# Patient Record
Sex: Male | Born: 1980 | Race: Black or African American | Hispanic: No | Marital: Married | Smoking: Former smoker
Health system: Southern US, Community
[De-identification: ages and names within clinical notes are randomized; demographics above are authoritative.]

## PROBLEM LIST (undated history)

## (undated) DIAGNOSIS — J45909 Unspecified asthma, uncomplicated: Secondary | ICD-10-CM

## (undated) HISTORY — PX: WISDOM TOOTH EXTRACTION: SHX21

---

## 2003-05-08 ENCOUNTER — Emergency Department (HOSPITAL_COMMUNITY): Admission: EM | Admit: 2003-05-08 | Discharge: 2003-05-08 | Payer: Self-pay | Admitting: Emergency Medicine

## 2003-08-12 ENCOUNTER — Emergency Department (HOSPITAL_COMMUNITY): Admission: EM | Admit: 2003-08-12 | Discharge: 2003-08-12 | Payer: Self-pay | Admitting: Emergency Medicine

## 2010-04-06 ENCOUNTER — Emergency Department (HOSPITAL_COMMUNITY)
Admission: EM | Admit: 2010-04-06 | Discharge: 2010-04-06 | Payer: Self-pay | Source: Home / Self Care | Admitting: Family Medicine

## 2011-03-26 ENCOUNTER — Inpatient Hospital Stay (INDEPENDENT_AMBULATORY_CARE_PROVIDER_SITE_OTHER)
Admission: RE | Admit: 2011-03-26 | Discharge: 2011-03-26 | Disposition: A | Discharge status: Home / Self Care | Payer: 59 | Source: Ambulatory Visit | Attending: Emergency Medicine | Admitting: Emergency Medicine

## 2011-03-26 DIAGNOSIS — J309 Allergic rhinitis, unspecified: Secondary | ICD-10-CM

## 2011-04-04 ENCOUNTER — Ambulatory Visit
Admission: RE | Admit: 2011-04-04 | Discharge: 2011-04-04 | Disposition: A | Discharge status: Home / Self Care | Payer: 59 | Source: Ambulatory Visit | Attending: Family Medicine | Admitting: Family Medicine

## 2011-04-04 ENCOUNTER — Other Ambulatory Visit: Payer: Self-pay | Admitting: Family Medicine

## 2011-04-04 DIAGNOSIS — M25511 Pain in right shoulder: Secondary | ICD-10-CM

## 2013-07-16 ENCOUNTER — Other Ambulatory Visit (HOSPITAL_COMMUNITY)
Admission: RE | Admit: 2013-07-16 | Discharge: 2013-07-16 | Disposition: A | Discharge status: Home / Self Care | Payer: 59 | Source: Ambulatory Visit | Attending: Family Medicine | Admitting: Family Medicine

## 2013-07-16 DIAGNOSIS — Z113 Encounter for screening for infections with a predominantly sexual mode of transmission: Secondary | ICD-10-CM | POA: Insufficient documentation

## 2013-07-17 ENCOUNTER — Emergency Department (HOSPITAL_COMMUNITY)
Admission: EM | Admit: 2013-07-17 | Discharge: 2013-07-18 | Discharge status: Against Medical Advice | Payer: 59 | Attending: Emergency Medicine | Admitting: Emergency Medicine

## 2013-07-17 ENCOUNTER — Emergency Department (HOSPITAL_COMMUNITY)
Admission: EM | Admit: 2013-07-17 | Discharge: 2013-07-17 | Disposition: A | Discharge status: Home / Self Care | Payer: 59 | Source: Home / Self Care | Attending: Family Medicine | Admitting: Family Medicine

## 2013-07-17 ENCOUNTER — Emergency Department (INDEPENDENT_AMBULATORY_CARE_PROVIDER_SITE_OTHER): Payer: 59

## 2013-07-17 ENCOUNTER — Encounter (HOSPITAL_COMMUNITY): Payer: Self-pay | Admitting: Emergency Medicine

## 2013-07-17 DIAGNOSIS — K5900 Constipation, unspecified: Secondary | ICD-10-CM

## 2013-07-17 NOTE — ED Notes (Signed)
Patient states he is going on the 4th day of not having a bowel movement.  States was seen at Urgent Care today and given Miralax and Dulcolax; patient states he has been taking these without relief.

## 2013-07-17 NOTE — ED Provider Notes (Signed)
CSN: [id number]     Arrival date & time 07/17/13  1458 History   First MD Initiated Contact with Patient 07/17/13 1609     Chief Complaint  Patient presents with  . Constipation   (Consider location/radiation/quality/duration/timing/severity/associated sxs/prior Treatment) Patient is a 33 y.o. male presenting with constipation. The history is provided by the patient.  Constipation Severity:  Mild Time since last bowel movement:  5 days Timing:  Constant Chronicity:  New Context: not dehydration, not dietary changes and not medication   Stool description:  None produced Relieved by:  None tried Worsened by:  Nothing tried Ineffective treatments:  Laxatives and Miralax (only taken this am.) Associated symptoms: abdominal pain   Associated symptoms: no diarrhea, no fever, no nausea and no vomiting     History reviewed. No pertinent past medical history. History reviewed. No pertinent past surgical history. History reviewed. No pertinent family history. History  Substance Use Topics  . Smoking status: Never Smoker   . Smokeless tobacco: Not on file  . Alcohol Use: Yes    Review of Systems  Constitutional: Negative for fever.  Gastrointestinal: Positive for abdominal pain and constipation. Negative for nausea, vomiting, diarrhea, blood in stool, abdominal distention, anal bleeding and rectal pain.       No hemorrhoids.    Allergies  Review of patient's allergies indicates no known allergies.  Home Medications   Current Outpatient Rx  Name  Route  Sig  Dispense  Refill  . Bisacodyl (DULCOLAX PO)   Oral   Take by mouth.          BP 110/70  Pulse 79  Temp(Src) 98.5 F (36.9 C) (Oral)  Resp 22  SpO2 96% Physical Exam  Nursing note and vitals reviewed. Constitutional: He is oriented to person, place, and time. He appears well-developed and well-nourished.  Abdominal: Soft. Bowel sounds are normal. He exhibits no distension and no mass. There is no tenderness. There  is no rebound and no guarding.  Neurological: He is alert and oriented to person, place, and time.  Skin: Skin is warm and dry.    ED Course  Procedures (including critical care time) Labs Review Labs Reviewed - No data to display Imaging Review Dg Abd 1 View  07/17/2013   CLINICAL DATA:  No bowel movement in 4 days. Tightness in the mid to lower abdomen.  EXAM: ABDOMEN - 1 VIEW  COMPARISON:  None.  FINDINGS: Moderate increased stool is noted in the rectum and colon. There is no obstruction.  Normal soft tissues and bony structures.  IMPRESSION: No acute findings. Moderate increased stool in the colon and rectum.   Electronically Signed   By: Lajean Manes M.D.   On: 07/17/2013 16:53    EKG Interpretation    Date/Time:    Ventricular Rate:    PR Interval:    QRS Duration:   QT Interval:    QTC Calculation:   R Axis:     Text Interpretation:              MDM  X-rays reviewed and report per radiologist.    Billy Fischer, MD 07/17/13 [id number]

## 2013-07-17 NOTE — ED Notes (Signed)
Pt  Reports  He  Has  Been  Constipated  For  About  5  Days         Had  A  Hard  bm        On  Sunday          He reports  Some  Bloating  At this  Time            He           denys  Any  History     Of  Constipation          He  denys  Any  Vomiting  He  Ambulated  To  Room  With a  Steady  Fluid  Gait

## 2013-07-17 NOTE — ED Notes (Signed)
No answer in all waiting rooms

## 2013-07-17 NOTE — Discharge Instructions (Signed)
Take miralax daily, drink plenty of water, see your doctor if further problems.

## 2013-07-17 NOTE — ED Notes (Signed)
Pt  Advised  Per  Dr  Juventino Slovak  That  He  May  Get a  Bottle  Of  mag  Citrate   And  Drink  Tonight        Pt  Advised  To  Drink lots  Of  Fluids  Tonight

## 2013-07-18 NOTE — ED Notes (Signed)
Unable to locate pt in all waiting areas 

## 2013-07-18 NOTE — ED Provider Notes (Signed)
I did not personally evaluate this patient.  This patient left the emergency department after triage but before being seen by a physician or physician mid-level  Hoy Morn, MD 07/18/13 [phone]

## 2014-08-22 ENCOUNTER — Encounter (HOSPITAL_COMMUNITY): Payer: Self-pay

## 2014-08-22 ENCOUNTER — Emergency Department (INDEPENDENT_AMBULATORY_CARE_PROVIDER_SITE_OTHER)
Admission: EM | Admit: 2014-08-22 | Discharge: 2014-08-22 | Disposition: A | Discharge status: Home / Self Care | Payer: 59 | Source: Home / Self Care

## 2014-08-22 DIAGNOSIS — R21 Rash and other nonspecific skin eruption: Secondary | ICD-10-CM

## 2014-08-22 DIAGNOSIS — I10 Essential (primary) hypertension: Secondary | ICD-10-CM

## 2014-08-22 MED ORDER — CEPHALEXIN 500 MG PO CAPS: 500.0000 mg | ORAL_CAPSULE | Freq: Two times a day (BID) | ORAL | Status: DC

## 2014-08-22 MED ORDER — PERMETHRIN 5 % EX CREA: TOPICAL_CREAM | CUTANEOUS | Status: DC

## 2014-08-22 MED ORDER — PREDNISONE 50 MG PO TABS: ORAL_TABLET | ORAL | Status: DC

## 2014-08-22 NOTE — ED Notes (Signed)
C/o 2 week duration of rash on scalp, upper chest, upper back. Denies changes in diet, medications, detergent. No one else in home affected

## 2014-08-22 NOTE — Discharge Instructions (Signed)
The cause of your rash is not clear. This may be an allergic reaction, bacterial infection called folliculitis, or from scabies. Please start the antibiotic for the possible bacterial infection. Please use the permethrin cream as instructed. Please use the steroids for itching. Please remember to change your washcloths and razor very frequently. Please return if her symptoms do not improve. Please consider eating some yogurt daily or taking a probiotic while on antibiotics if you develop any diarrhea or upset stomach.

## 2014-08-22 NOTE — ED Provider Notes (Signed)
CSN: [id number]     Arrival date & time 08/22/14  6144 History   None    Chief Complaint  Patient presents with  . Rash   (Consider location/radiation/quality/duration/timing/severity/associated sxs/prior Treatment) HPI  Rash: started 2 wks ago. No change in soaps, lotions, or detergents. No change in diet. midly itchy. Benadryl w/o benefit. Rash is on head (shaved), and upper back and chest. Pt works out at gym and puts towel on head and shoulders every time. Lives w/ daughter and father and no one else w/ rash. Rash is not changing in nature or location. Not getting worse. Stated hotel bed 3 weeks ago. Uses a washcloth in the shower that he changes once every couple of days. Uses an antibacterial soap.  History reviewed. No pertinent past medical history. History reviewed. No pertinent past surgical history. Family History  Problem Relation Age of Onset  . Cancer Mother     stomach????   History  Substance Use Topics  . Smoking status: Never Smoker   . Smokeless tobacco: Not on file  . Alcohol Use: Yes    Review of Systems Per HPI with all other pertinent systems negative.   Allergies  Review of patient's allergies indicates no known allergies.  Home Medications   Prior to Admission medications   Medication Sig Start Date End Date Taking? Authorizing Provider  Bisacodyl (DULCOLAX PO) Take by mouth.    Historical Provider, MD  cephALEXin (KEFLEX) 500 MG capsule Take 1 capsule (500 mg total) by mouth 2 (two) times daily. 08/22/14   Waldemar Dickens, MD  permethrin (ELIMITE) 5 % cream Apply topically to entire body and leave on overnight. Wash off in the morning and reapply in 14 days 08/22/14   Waldemar Dickens, MD  predniSONE (DELTASONE) 50 MG tablet Take daily with breakfast 08/22/14   Waldemar Dickens, MD   BP 147/91 mmHg  Pulse 58  Temp(Src) 97.7 F (36.5 C) (Oral)  SpO2 98% Physical Exam  Constitutional: He is oriented to person, place, and time. He appears  well-developed and well-nourished.  HENT:  Head: Normocephalic and atraumatic.  Eyes: EOM are normal. Pupils are equal, round, and reactive to light.  Neck: Normal range of motion.  Cardiovascular: Normal rate.   No murmur heard. Pulmonary/Chest: Effort normal and breath sounds normal. No respiratory distress.  Abdominal: Soft. Bowel sounds are normal. He exhibits no distension.  Musculoskeletal: Normal range of motion. He exhibits no edema or tenderness.  Neurological: He is alert and oriented to person, place, and time.  Skin:  Small incised and raised lesions over the scalp. Lower neck upper shoulder and chest region with few red raised lesions surrounding hair follicles.  Psychiatric: He has a normal mood and affect. His behavior is normal.    ED Course  Procedures (including critical care time) Labs Review Labs Reviewed - No data to display  Imaging Review No results found.   MDM   1. Rash   2. Essential hypertension    Rash etiology is not clear. Favor thick folliculitis versus scabies versus allergic reaction. Patient to start Keflex 500 twice a day. If no improvement of also prescribed permethrin cream and instructed patient on proper use. Start 5 day of prednisone 50 for itching relief. Pamala Duffel, MD Family Medicine 08/22/2014, 10:17 AM      Waldemar Dickens, MD 08/22/14 1017

## 2014-12-13 IMAGING — CR DG ABDOMEN 1V
1 series · 1 of 1 positions shown · non-contrast
Comparison: None.

CLINICAL DATA: No bowel movement in 4 days. Tightness in the mid to
lower abdomen.

EXAM:
ABDOMEN - 1 VIEW

[view not recorded]
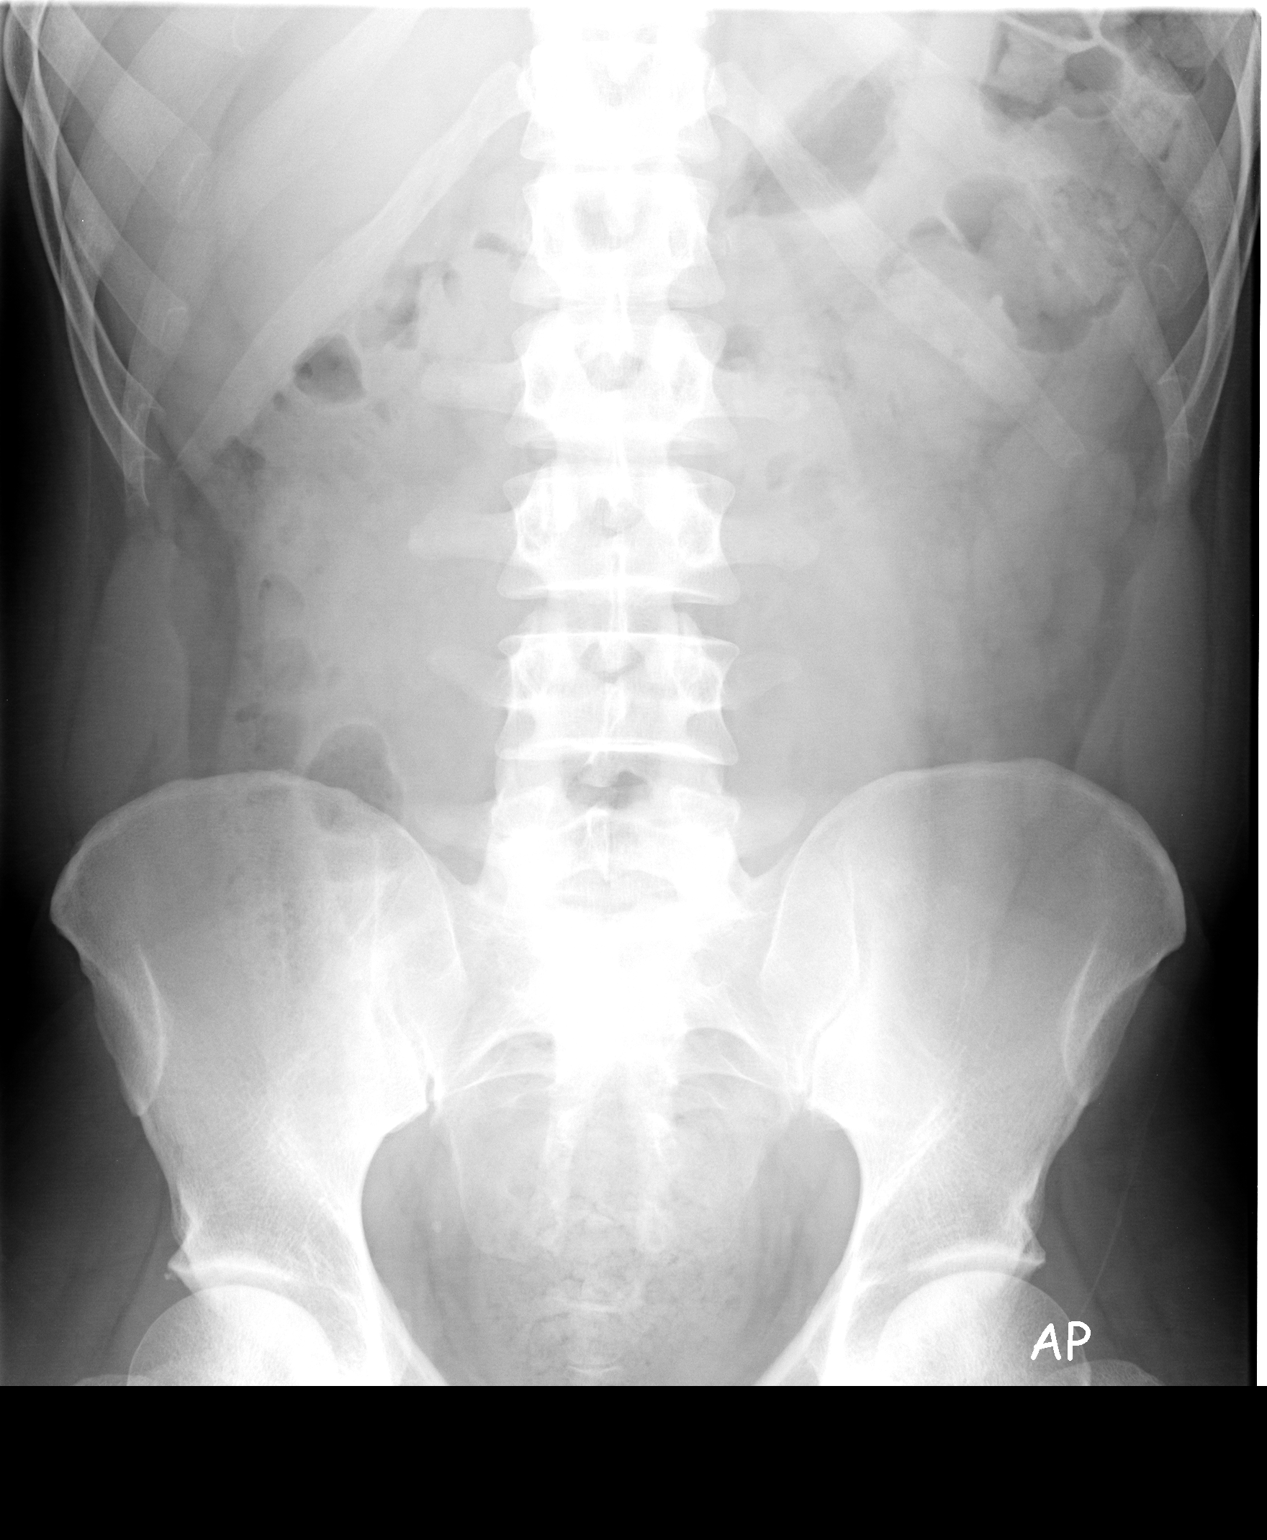

[1 of 1 positions shown; findings below may reference images not displayed]

FINDINGS: Moderate increased stool is noted in the rectum and colon. There is
no obstruction.

Normal soft tissues and bony structures.
IMPRESSION: No acute findings. Moderate increased stool in the colon and rectum.

## 2015-09-07 ENCOUNTER — Encounter: Payer: Self-pay | Admitting: Family Medicine

## 2015-09-07 ENCOUNTER — Ambulatory Visit (INDEPENDENT_AMBULATORY_CARE_PROVIDER_SITE_OTHER): Payer: Commercial Managed Care - HMO | Admitting: Family Medicine

## 2015-09-07 VITALS — Temp 99.2°F | Wt 247.0 lb

## 2015-09-07 DIAGNOSIS — K122 Cellulitis and abscess of mouth: Secondary | ICD-10-CM | POA: Diagnosis not present

## 2015-09-07 DIAGNOSIS — J020 Streptococcal pharyngitis: Secondary | ICD-10-CM | POA: Diagnosis not present

## 2015-09-07 DIAGNOSIS — J029 Acute pharyngitis, unspecified: Secondary | ICD-10-CM | POA: Diagnosis not present

## 2015-09-07 LAB — POCT RAPID STREP A (OFFICE): Rapid Strep A Screen: POSITIVE — AB

## 2015-09-07 MED ORDER — AMOXICILLIN-POT CLAVULANATE 875-125 MG PO TABS: 1.0000 | ORAL_TABLET | Freq: Two times a day (BID) | ORAL | Status: DC

## 2015-09-07 MED ORDER — CEFTRIAXONE SODIUM 1 G IJ SOLR
500.0000 mg | Freq: Once | INTRAMUSCULAR | Status: AC
  Administered 2015-09-07: 500 mg via INTRAMUSCULAR

## 2015-09-07 NOTE — Progress Notes (Signed)
   Subjective:    Patient ID: Jeffrey Cummings, male    DOB: [birth year], 35 y.o.   MRN: [id number]  Sore Throat  This is a new problem. The current episode started today. Associated symptoms comments: Low grade fever. Treatments tried: salt water, sol u medrol injection    Patient relates a swollen sore throat went to the nurse practitioner's were was diagnosed with strep placed on amoxicillin patient decided not taken. Patient decided to come in and be seen. He was seen by the nurse practitioner earlier this month for the flu and just recently stopped Tamiflu. Patient also had pinkeye a few weeks ago no other illness   Review of Systems Significant pharyngitis relates soreness with swallowing denies high fever chills sweats.    Objective:   Physical Exam  Uvula is red swollen. Tonsillar region swollen erythematous neck no masses lungs clear  Patient not toxic    Assessment & Plan:  Strep throat Uvulitis Antibiotics prescribed Rocephin shot given Should gradually get better over the next few days Work excuse for the next 3 days It progressively worse notify us or go to ER

## 2017-01-18 DIAGNOSIS — D179 Benign lipomatous neoplasm, unspecified: Secondary | ICD-10-CM | POA: Diagnosis not present

## 2017-01-18 DIAGNOSIS — K219 Gastro-esophageal reflux disease without esophagitis: Secondary | ICD-10-CM | POA: Diagnosis not present

## 2017-01-18 DIAGNOSIS — Z0000 Encounter for general adult medical examination without abnormal findings: Secondary | ICD-10-CM | POA: Diagnosis not present

## 2018-01-17 DIAGNOSIS — K219 Gastro-esophageal reflux disease without esophagitis: Secondary | ICD-10-CM | POA: Diagnosis not present

## 2018-01-17 DIAGNOSIS — Z0000 Encounter for general adult medical examination without abnormal findings: Secondary | ICD-10-CM | POA: Diagnosis not present

## 2018-01-17 DIAGNOSIS — D170 Benign lipomatous neoplasm of skin and subcutaneous tissue of head, face and neck: Secondary | ICD-10-CM | POA: Diagnosis not present

## 2018-03-20 DIAGNOSIS — Z23 Encounter for immunization: Secondary | ICD-10-CM | POA: Diagnosis not present

## 2018-05-08 DIAGNOSIS — Z5181 Encounter for therapeutic drug level monitoring: Secondary | ICD-10-CM | POA: Diagnosis not present

## 2018-05-30 ENCOUNTER — Ambulatory Visit: Payer: Self-pay | Admitting: Surgery

## 2018-05-30 DIAGNOSIS — D170 Benign lipomatous neoplasm of skin and subcutaneous tissue of head, face and neck: Secondary | ICD-10-CM | POA: Diagnosis not present

## 2018-05-30 NOTE — H&P (Signed)
History of Present Illness Jeffrey Cummings. Jeffrey Dowd MD; 05/30/2018 3:12 PM) The patient is a 37 year old male who presents with a complaint of Mass. PCP is Dr. Iona Beard Referred for enlarging mass on his posterior neck This is a healthy 37 year old male who presents with several years of a small palpable mass on the left posterior neck. This has become larger and is becoming uncomfortable. It has never become infected. There is no drainage from the site. He was examined by Dr. Berdine Addison who felt that this represented a lipoma. He is referred to [place] for surgical evaluation for excision.   Past Surgical History Andreas Blower, [place]; 05/30/2018 2:26 PM) Oral Surgery  Diagnostic Studies History Andreas Blower, [place]; 05/30/2018 2:26 PM) Colonoscopy never  Allergies Andreas Blower, [place]; 05/30/2018 2:29 PM) No Known Drug Allergies [05/30/2018]:  Medication History Andreas Blower, [place]; 05/30/2018 2:29 PM) No Current Medications Medications Reconciled  Social History Andreas Blower, CMA; 05/30/2018 2:26 PM) Alcohol use Moderate alcohol use. Caffeine use Tea. Illicit drug use Remotely quit drug use. Tobacco use Former smoker.  Family History Andreas Blower, [place]; 05/30/2018 2:26 PM) Colon Cancer Mother.  Other Problems Andreas Blower, CMA; 05/30/2018 2:26 PM) Asthma Gastroesophageal Reflux Disease     Review of Systems (Armen Ferguson CMA; 05/30/2018 2:26 PM) General Not Present- Appetite Loss, Chills, Fatigue, Fever, Night Sweats, Weight Gain and Weight Loss. Skin Not Present- Change in Wart/Mole, Dryness, Hives, Jaundice, New Lesions, Non-Healing Wounds, Rash and Ulcer. HEENT Present- Seasonal Allergies. Not Present- Earache, Hearing Loss, Hoarseness, Nose Bleed, Oral Ulcers, Ringing in the Ears, Sinus Pain, Sore Throat, Visual Disturbances, Wears glasses/contact lenses and Yellow Eyes. Respiratory Not Present- Bloody sputum, Chronic Cough, Difficulty Breathing, Snoring and  Wheezing. Breast Not Present- Breast Mass, Breast Pain, Nipple Discharge and Skin Changes. Cardiovascular Not Present- Chest Pain, Difficulty Breathing Lying Down, Leg Cramps, Palpitations, Rapid Heart Rate, Shortness of Breath and Swelling of Extremities. Gastrointestinal Not Present- Abdominal Pain, Bloating, Bloody Stool, Change in Bowel Habits, Chronic diarrhea, Constipation, Difficulty Swallowing, Excessive gas, Gets full quickly at meals, Hemorrhoids, Indigestion, Nausea, Rectal Pain and Vomiting. Male Genitourinary Not Present- Blood in Urine, Change in Urinary Stream, Frequency, Impotence, Nocturia, Painful Urination, Urgency and Urine Leakage. Musculoskeletal Not Present- Back Pain, Joint Pain, Joint Stiffness, Muscle Pain, Muscle Weakness and Swelling of Extremities. Neurological Not Present- Decreased Memory, Fainting, Headaches, Numbness, Seizures, Tingling, Tremor, Trouble walking and Weakness. Psychiatric Not Present- Anxiety, Bipolar, Change in Sleep Pattern, Depression, Fearful and Frequent crying. Endocrine Not Present- Cold Intolerance, Excessive Hunger, Hair Changes, Heat Intolerance, Hot flashes and New Diabetes. Hematology Not Present- Blood Thinners, Easy Bruising, Excessive bleeding, Gland problems, HIV and Persistent Infections.  Vitals (Armen Ferguson CMA; 05/30/2018 2:28 PM) 05/30/2018 2:27 PM Weight: 247.25 lb Height: 72in Body Surface Area: 2.33 m Body Mass Index: 33.53 kg/m  Temp.: 97.48F  Pulse: 87 (Regular)  P.OX: 94% (Room air) BP: 154/90 (Sitting, Left Arm, Standard)      Physical Exam Rodman Key K. Josephus Harriger MD; 05/30/2018 3:13 PM)  The physical exam findings are as follows: Note:WDWN in NAD Eyes: Pupils equal, round; sclera anicteric HENT: Oral mucosa moist; good dentition Neck: 4 x 3 cm subcutaneous mass, left posterior neck; no drainage; no sign of infection; no thyromegaly Lungs: CTA bilaterally; normal respiratory effort CV: Regular rate and  rhythm; no murmurs; extremities well-perfused with no edema Abd: +bowel sounds, soft, non-tender, no palpable organomegaly; no palpable hernias Skin: Warm, dry; no sign of jaundice Psychiatric - alert and oriented x 4;  calm mood and affect    Assessment & Plan Jeffrey Cummings. 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 The surgical procedure has been discussed with the patient. Potential risks, benefits, alternative treatments, and expected outcomes have been explained. All of the patient's questions at this time have been answered. The likelihood of reaching the patient's treatment goal is good. The patient understand the proposed surgical procedure and wishes to proceed.  Jeffrey Cummings. Georgette Dover, MD, [facility] Surgery  General/ Trauma Surgery Beeper [phone]  05/30/2018 3:13 PM

## 2018-06-04 ENCOUNTER — Ambulatory Visit: Payer: Commercial Managed Care - HMO | Admitting: Family Medicine

## 2018-06-04 ENCOUNTER — Encounter: Payer: Self-pay | Admitting: Family Medicine

## 2018-06-04 ENCOUNTER — Ambulatory Visit: Payer: 59 | Admitting: Family Medicine

## 2018-06-04 VITALS — BP 152/90 | Temp 99.0°F | Wt 243.0 lb

## 2018-06-04 DIAGNOSIS — J029 Acute pharyngitis, unspecified: Secondary | ICD-10-CM | POA: Diagnosis not present

## 2018-06-04 DIAGNOSIS — J020 Streptococcal pharyngitis: Secondary | ICD-10-CM | POA: Diagnosis not present

## 2018-06-04 LAB — POCT RAPID STREP A (OFFICE): Rapid Strep A Screen: POSITIVE — AB

## 2018-06-04 MED ORDER — AMOXICILLIN-POT CLAVULANATE 875-125 MG PO TABS: 1.0000 | ORAL_TABLET | Freq: Two times a day (BID) | ORAL | 0 refills | Status: DC

## 2018-06-04 NOTE — Progress Notes (Signed)
   Subjective:    Patient ID: Jeffrey Cummings, male    DOB: 02/26/1981, 37 y.o.   MRN: [id number]  HPI  Patient is here today with complaints of a sore throat on right side, right ear pain, hurts to swallow, mild headache.Mucux clearing in throat with thick green mucus. He states this started on Thursday.Was checked for strep at work and it was negative. He has been taking Thera flu since Thursday this is his third box he is on, used cloroceptic.  Significant sore throat on the right side but started off with sore throat on both sides but over the weekend is primarily more his right side no difficulty with drooling but he does state it hurts to swallow denies high fever chills Review of Systems  Constitutional: Negative for activity change, chills and fever.  HENT: Positive for congestion and rhinorrhea. Negative for ear pain.   Eyes: Negative for discharge.  Respiratory: Positive for cough. Negative for wheezing.   Cardiovascular: Negative for chest pain.  Gastrointestinal: Negative for nausea and vomiting.  Musculoskeletal: Negative for arthralgias.       Objective:   Physical Exam  Constitutional: He appears well-developed.  HENT:  Head: Normocephalic.  Mouth/Throat: Oropharyngeal exudate present. No tonsillar abscesses.  Neck: Normal range of motion.  Cardiovascular: Normal rate, regular rhythm and normal heart sounds.  No murmur heard. Pulmonary/Chest: Effort normal and breath sounds normal. He has no wheezes.  Lymphadenopathy:    He has no cervical adenopathy.  Neurological: He exhibits normal muscle tone.  Skin: Skin is warm and dry.  Nursing note and vitals reviewed.  I do not see tonsillar abscess with this but I do see bilateral throat erythema with enlargement and redness and some exudate  Results for orders placed or performed in visit on 06/04/18  POCT rapid strep A  Result Value Ref Range   Rapid Strep A Screen Positive (A) Negative        Assessment &  Plan:  Strep throat Patient was warned that if this becomes worse he may need to be rechecked Start antibiotics he should see an improvement over the course the next 2 to 3 days Follow-up if progressive troubles or if worse

## 2018-06-04 NOTE — Patient Instructions (Signed)

## 2019-01-09 ENCOUNTER — Other Ambulatory Visit: Payer: Self-pay

## 2019-01-09 ENCOUNTER — Emergency Department (HOSPITAL_COMMUNITY)
Admission: EM | Admit: 2019-01-09 | Discharge: 2019-01-09 | Disposition: A | Discharge status: Home / Self Care | Payer: 59 | Attending: Emergency Medicine | Admitting: Emergency Medicine

## 2019-01-09 ENCOUNTER — Encounter (HOSPITAL_COMMUNITY): Payer: Self-pay

## 2019-01-09 DIAGNOSIS — Z20828 Contact with and (suspected) exposure to other viral communicable diseases: Secondary | ICD-10-CM | POA: Diagnosis not present

## 2019-01-09 DIAGNOSIS — R0602 Shortness of breath: Secondary | ICD-10-CM | POA: Insufficient documentation

## 2019-01-09 DIAGNOSIS — J45909 Unspecified asthma, uncomplicated: Secondary | ICD-10-CM | POA: Diagnosis not present

## 2019-01-09 HISTORY — DX: Unspecified asthma, uncomplicated: J45.909

## 2019-01-09 NOTE — Discharge Instructions (Signed)
You likely have a viral illness.  This should be treated symptomatically. You are being tested for coronavirus.  If the results are positive, you will receive a phone call.  If negative, you will not.  Either way, you may check online on MyChart. It is important that you quarantine at home until we have the results back.  If negative, you may go back to work and continue to resume your normal activities.  If positive, you will need to stay at home and continue to quarantine for a total of a minimum of 7 days from when symptoms began, as long as you are also fever free for 3 days and symptoms are improving. Return to the emergency room if you develop severe worsening shortness of breath. Continue use your inhaler as needed for shortness of breath or wheezing. Use cough syrups as needed for cough. Return to the ER with any new, worsening, or concerning symptoms.

## 2019-01-09 NOTE — ED Triage Notes (Addendum)
Per pt: For the last few days "my breathing hasn't been right. I feel like I can't get my breath in. I feel like I'm breathing hard". Pt endorsers dyspnea with exertion and at rest. Pts lungs CTA. Pt states that his friend and his whole family tested positive for COVID 19. Pt 100% on room air and does not appear to be in any acute distress in triage. Pt states that he has a hx of asthma, not on daily medication for this and has a rescue albuterol inhaler that he has been using with some relief. Pt states that he has still been exercising and running.

## 2019-01-09 NOTE — ED Provider Notes (Signed)
[facility] EMERGENCY DEPARTMENT Provider Note   CSN: [id number] Arrival date & time: 01/09/19  0719    History   Chief Complaint Chief Complaint  Patient presents with  . Shortness of Breath    HPI Jeffrey Cummings is a 38 y.o. male presenting for evaluation of shortness of breath.  Patient states that the past 2 to 3 days, he has had increased shortness of breath.  He states he is still able to do his normal activities and still able to exercise, but he feels like he is working slightly harder to breathe.  He also reports a dry cough.  He has been using his inhaler daily, which is improved his symptoms.  Normally he does not need his inhaler daily.  He states he has a friend who tested positive for coronavirus recently.  He denies known sick contacts at work.  He has associated nasal congestion.  He denies fevers, chills, sore throat, chest pain, nausea, vomiting, abdominal pain, diarrhea.  He has no medical problems other than asthma.  He takes no medications daily.  He denies tobacco use.      HPI  Past Medical History:  Diagnosis Date  . Asthma     There are no active problems to display for this patient.   History reviewed. No pertinent surgical history.      Home Medications    Prior to Admission medications   Medication Sig Start Date End Date Taking? Authorizing Provider  amoxicillin-clavulanate (AUGMENTIN) 875-125 MG tablet Take 1 tablet by mouth 2 (two) times daily. 06/04/18   Kathyrn Drown, MD    Family History Family History  Problem Relation Age of Onset  . Cancer Mother        stomach????    Social History Social History   Tobacco Use  . Smoking status: Never Smoker  . Smokeless tobacco: Current User  Substance Use Topics  . Alcohol use: Yes  . Drug use: No     Allergies   Patient has no known allergies.   Review of Systems Review of Systems  All other systems reviewed and are negative.    Physical Exam  Updated Vital Signs BP (!) 146/103 (BP Location: Right Arm)   Pulse (!) 57   Temp 98.2 F (36.8 C) (Oral)   Resp 18   SpO2 100%   Physical Exam Vitals signs and nursing note reviewed.  Constitutional:      General: He is not in acute distress.    Appearance: He is well-developed.     Comments: Appears nontoxic  HENT:     Head: Normocephalic and atraumatic.  Eyes:     Extraocular Movements: Extraocular movements intact.     Conjunctiva/sclera: Conjunctivae normal.     Pupils: Pupils are equal, round, and reactive to light.  Neck:     Musculoskeletal: Normal range of motion and neck supple.  Cardiovascular:     Rate and Rhythm: Normal rate and regular rhythm.     Pulses: Normal pulses.  Pulmonary:     Effort: Pulmonary effort is normal. No respiratory distress.     Breath sounds: Normal breath sounds. No wheezing.     Comments: Speaking in full sentences. Clear lung sounds in all fields. No respiratory distress.  Abdominal:     General: There is no distension.     Palpations: Abdomen is soft. There is no mass.     Tenderness: There is no abdominal tenderness. There is no guarding or rebound.  Musculoskeletal: Normal range of motion.     Right lower leg: No edema.     Left lower leg: No edema.     Comments: No leg pain or swelling  Skin:    General: Skin is warm and dry.     Capillary Refill: Capillary refill takes less than 2 seconds.  Neurological:     Mental Status: He is alert and oriented to person, place, and time.      ED Treatments / Results  Labs (all labs ordered are listed, but only abnormal results are displayed) Labs Reviewed  NOVEL CORONAVIRUS, NAA (HOSPITAL ORDER, SEND-OUT TO REF LAB)    EKG None  Radiology No results found.  Procedures Procedures (including critical care time)  Medications Ordered in ED Medications - No data to display   Initial Impression / Assessment and Plan / ED Course  I have reviewed the triage vital signs and the  nursing notes.  Pertinent labs & imaging results that were available during my care of the patient were reviewed by me and considered in my medical decision making (see chart for details).        Pt presenting for evaluation sob.  Physical examination, he appears nontoxic.  Pulmonary exam reassuring, clear lung sounds.  Sats at 100% on room air.  No signs of respiratory distress or shortness of breath at this time.  Consider likely viral illness, possibly coronavirus.  Will perform send out testing.  Ideally x-ray be beneficial today, low suspicion for pneumonia.  Discussed findings and plan with patient.  Discussed importance of quarantine until results have returned.  Discussed strict monitoring for worsening of symptoms, return if he develops worsening shortness of breath.  At this time, patient appears safe for discharge.  Return precautions given.  Patient states he understands and agrees to plan.  Jeffrey Cummings was evaluated in Emergency Department on 01/09/2019 for the symptoms described in the history of present illness. He was evaluated in the context of the global COVID-19 pandemic, which necessitated consideration that the patient might be at risk for infection with the SARS-CoV-2 virus that causes COVID-19. Institutional protocols and algorithms that pertain to the evaluation of patients at risk for COVID-19 are in a state of rapid change based on information released by regulatory bodies including the CDC and federal and state organizations. These policies and algorithms were followed during the patient's care in the ED.   Final Clinical Impressions(s) / ED Diagnoses   Final diagnoses:  Shortness of breath    ED Discharge Orders    None       Jeffrey Heidelberg, PA-C 01/09/19 0809    Long, Jeffrey Olds, MD 01/09/19 0900

## 2019-01-10 LAB — NOVEL CORONAVIRUS, NAA (HOSP ORDER, SEND-OUT TO REF LAB; TAT 18-24 HRS): SARS-CoV-2, NAA: NOT DETECTED

## 2020-12-07 ENCOUNTER — Other Ambulatory Visit: Payer: Self-pay | Admitting: Urology

## 2020-12-08 LAB — PSA: Prostate Specific Ag, Serum: 0.4 ng/mL (ref 0.0–4.0)

## 2020-12-09 ENCOUNTER — Telehealth: Payer: Self-pay

## 2020-12-09 NOTE — Telephone Encounter (Signed)
Patient informed normal PSA results(0.4), recheck in 1 year. Patient verbalized understanding.

## 2021-01-05 ENCOUNTER — Other Ambulatory Visit: Payer: Self-pay

## 2021-01-05 ENCOUNTER — Ambulatory Visit (INDEPENDENT_AMBULATORY_CARE_PROVIDER_SITE_OTHER): Payer: 59

## 2021-01-05 ENCOUNTER — Ambulatory Visit: Payer: 59 | Admitting: Podiatry

## 2021-01-05 DIAGNOSIS — M216X9 Other acquired deformities of unspecified foot: Secondary | ICD-10-CM

## 2021-01-05 DIAGNOSIS — M722 Plantar fascial fibromatosis: Secondary | ICD-10-CM

## 2021-01-05 DIAGNOSIS — M7732 Calcaneal spur, left foot: Secondary | ICD-10-CM

## 2021-01-05 MED ORDER — BETAMETHASONE SOD PHOS & ACET 6 (3-3) MG/ML IJ SUSP
6.0000 mg | Freq: Once | INTRAMUSCULAR | Status: AC
  Administered 2021-01-05: 6 mg

## 2021-01-05 NOTE — Patient Instructions (Signed)

## 2021-01-05 NOTE — Progress Notes (Signed)
  Subjective:  Patient ID: Jeffrey Cummings, male    DOB: 01/08/1981,  MRN: [id number]  Chief Complaint  Patient presents with   Heel Spurs    Left foot heel pain x 3 weeks. Pt states pain came once he started using the incline treadmill.     40 y.o. male presents with the above complaint. History confirmed with patient.   Objective:  Physical Exam: warm, good capillary refill, no trophic changes or ulcerative lesions, normal DP and PT pulses, and normal sensory exam. Left Foot: tenderness to palpation medial calcaneal tuber, no pain with calcaneal squeeze, decreased ankle joint ROM, and +Silverskiold test  Radiographs: X-ray of the left foot: no evidence of calcaneal stress fracture, plantar calcaneal spur, posterior calcaneal spur, and Haglund deformity noted  Assessment:   1. Plantar fasciitis of left foot   2. Equinus deformity of foot   3. Calcaneal spur of left foot      Plan:  Patient was evaluated and treated and all questions answered.  Plantar Fasciitis -XR reviewed with patient -Educated patient on stretching and icing of the affected limb -Plantar fascial brace dispensed -Injection delivered to the plantar fascia of the left foot.  Procedure: Injection Tendon/Ligament Consent: Verbal consent obtained. Location: Left plantar fascia at the glabrous junction; medial approach. Skin Prep: Alcohol. Injectate: 1 cc 0.5% marcaine plain, 1 cc betamethasone acetate-betamethasone sodium phosphate Disposition: Patient tolerated procedure well. Injection site dressed with a band-aid.  No follow-ups on file.

## 2021-02-05 ENCOUNTER — Ambulatory Visit: Payer: 59 | Admitting: Podiatry

## 2021-02-05 ENCOUNTER — Other Ambulatory Visit: Payer: Self-pay

## 2021-02-05 DIAGNOSIS — M722 Plantar fascial fibromatosis: Secondary | ICD-10-CM

## 2021-02-05 MED ORDER — TRIAMCINOLONE ACETONIDE 40 MG/ML IJ SUSP
20.0000 mg | Freq: Once | INTRAMUSCULAR | Status: AC
  Administered 2021-02-05: 20 mg

## 2021-02-05 NOTE — Progress Notes (Signed)
  Subjective:  Patient ID: Jeffrey Cummings, male    DOB: 10/01/1980,  MRN: [id number]  Chief Complaint  Patient presents with   Plantar Fasciitis    Pt states injection effective and wears his pf brace. Pt still in pain on lateral aspect but has had some improvement.   40 y.o. male presents with the above complaint. History confirmed with patient.   Objective:  Physical Exam: warm, good capillary refill, no trophic changes or ulcerative lesions, normal DP and PT pulses, and normal sensory exam. Left Foot: tenderness to palpation lateral calcaneal tuber, no pain with calcaneal squeeze, decreased ankle joint ROM, and +Silverskiold test Assessment:   1. Plantar fasciitis of left foot    Plan:  Patient was evaluated and treated and all questions answered.  Plantar Fasciitis -Much improved but with continued pain. -Repeat injection  -Dispense NS -Continue stretching. F/u should issues persist.  Procedure: Injection Tendon/Ligament Consent: Verbal consent obtained. Location: Left plantar fascia at the glabrous junction; lateral approach. Skin Prep: Alcohol. Injectate: 1 cc 0.5% marcaine plain, 0.5cc kenalog 40 Disposition: Patient tolerated procedure well. Injection site dressed with a band-aid.  Return if symptoms worsen or fail to improve.

## 2021-06-15 ENCOUNTER — Ambulatory Visit: Payer: 59 | Admitting: Podiatry

## 2021-06-15 ENCOUNTER — Other Ambulatory Visit: Payer: Self-pay

## 2021-06-15 ENCOUNTER — Encounter: Payer: Self-pay | Admitting: Podiatry

## 2021-06-15 DIAGNOSIS — M722 Plantar fascial fibromatosis: Secondary | ICD-10-CM | POA: Diagnosis not present

## 2021-06-15 MED ORDER — TRIAMCINOLONE ACETONIDE 40 MG/ML IJ SUSP
20.0000 mg | Freq: Once | INTRAMUSCULAR | Status: AC
  Administered 2021-06-15: 15:00:00 20 mg

## 2021-06-15 NOTE — Progress Notes (Signed)
°  Subjective:  Patient ID: Jeffrey Cummings, male    DOB: 02-13-1981,  MRN: [id number]  Chief Complaint  Patient presents with   Plantar Fasciitis    Follow up left heel   "It was the whole heel that hurt, now its the side of the foot and the side of the heel (laterally)"   40 y.o. male presents with the above complaint. History confirmed with patient.   Objective:  Physical Exam: warm, good capillary refill, no trophic changes or ulcerative lesions, normal DP and PT pulses, and normal sensory exam. Left Foot: tenderness to palpation lateral calcaneal tuber, no pain with calcaneal squeeze, decreased ankle joint ROM, and +Silverskiold test Assessment:   1. Plantar fasciitis of left foot    Plan:  Patient was evaluated and treated and all questions answered.  Plantar Fasciitis -Repeat injection today with lateral approach - this should resolve residual inflammation. F/u should it persist.  Procedure: Injection Tendon/Ligament Consent: Verbal consent obtained. Location: Left plantar fascia at the glabrous junction; lateral approach. Skin Prep: Alcohol. Injectate: 1 cc 0.5% marcaine plain, 0.5 cc kenalog 40 Disposition: Patient tolerated procedure well. Injection site dressed with a band-aid.  Return if symptoms worsen or fail to improve, for Plantar fasciitis, left foot.

## 2021-10-28 ENCOUNTER — Other Ambulatory Visit: Payer: Self-pay | Admitting: Family Medicine

## 2021-10-28 ENCOUNTER — Ambulatory Visit (INDEPENDENT_AMBULATORY_CARE_PROVIDER_SITE_OTHER): Payer: Self-pay

## 2021-10-28 DIAGNOSIS — R002 Palpitations: Secondary | ICD-10-CM

## 2022-09-13 ENCOUNTER — Ambulatory Visit
Admission: RE | Admit: 2022-09-13 | Discharge: 2022-09-13 | Disposition: A | Discharge status: Home / Self Care | Payer: 59 | Source: Ambulatory Visit | Attending: Family Medicine | Admitting: Family Medicine

## 2022-09-13 VITALS — BP 138/90 | HR 75 | Temp 98.5°F | Resp 20

## 2022-09-13 DIAGNOSIS — J029 Acute pharyngitis, unspecified: Secondary | ICD-10-CM | POA: Insufficient documentation

## 2022-09-13 DIAGNOSIS — J069 Acute upper respiratory infection, unspecified: Secondary | ICD-10-CM | POA: Diagnosis not present

## 2022-09-13 LAB — POCT RAPID STREP A (OFFICE): Rapid Strep A Screen: NEGATIVE

## 2022-09-13 MED ORDER — FLUTICASONE PROPIONATE 50 MCG/ACT NA SUSP: 1.0000 | Freq: Two times a day (BID) | NASAL | 2 refills | Status: AC

## 2022-09-13 MED ORDER — PROMETHAZINE-DM 6.25-15 MG/5ML PO SYRP: 5.0000 mL | ORAL_SOLUTION | Freq: Four times a day (QID) | ORAL | 0 refills | Status: DC | PRN

## 2022-09-13 MED ORDER — PREDNISONE 20 MG PO TABS: 40.0000 mg | ORAL_TABLET | Freq: Every day | ORAL | 0 refills | Status: DC

## 2022-09-13 NOTE — ED Provider Notes (Signed)
[facility]    CSN: [id number] Arrival date & time: 09/13/22  1121      History   Chief Complaint Chief Complaint  Patient presents with   Sore Throat    HPI Jeffrey Cummings is a 42 y.o. male.   Patient presenting today with 3-day history of nasal congestion, sore swollen feeling throat, cough.  Denies fever, chills, body aches, chest pain, shortness of breath, abdominal pain, nausea vomiting or diarrhea.  So far trying DayQuil, NyQuil, Claritin with minimal relief.  Does have a history of asthma and allergies on Claritin and albuterol as needed.  No known sick contacts recently.    Past Medical History:  Diagnosis Date   Asthma     There are no problems to display for this patient.   History reviewed. No pertinent surgical history.     Home Medications    Prior to Admission medications   Medication Sig Start Date End Date Taking? Authorizing Provider  fluticasone (FLONASE) 50 MCG/ACT nasal spray Place 1 spray into both nostrils 2 (two) times daily. 09/13/22  Yes Volney American, PA-C  predniSONE (DELTASONE) 20 MG tablet Take 2 tablets (40 mg total) by mouth daily with breakfast. 09/13/22  Yes Volney American, PA-C  promethazine-dextromethorphan (PROMETHAZINE-DM) 6.25-15 MG/5ML syrup Take 5 mLs by mouth 4 (four) times daily as needed. 09/13/22  Yes Volney American, PA-C  acyclovir (ZOVIRAX) 400 MG tablet Take 400 mg by mouth daily. 02/04/21   [provider]  albuterol (VENTOLIN HFA) 108 (90 Base) MCG/ACT inhaler Inhale into the lungs. 02/02/21   [provider]    Family History Family History  Problem Relation Age of Onset   Cancer Mother        stomach????    Social History Social History   Tobacco Use   Smoking status: Never   Smokeless tobacco: Current  Substance Use Topics   Alcohol use: Yes   Drug use: No     Allergies   Patient has no known allergies.   Review of Systems Review of  Systems Per HPI  Physical Exam Triage Vital Signs ED Triage Vitals  Enc Vitals Group     BP 09/13/22 1205 (!) 138/90     Pulse Rate 09/13/22 1205 75     Resp 09/13/22 1205 20     Temp 09/13/22 1205 98.5 F (36.9 C)     Temp Source 09/13/22 1205 Oral     SpO2 09/13/22 1205 98 %     Weight --      Height --      Head Circumference --      Peak Flow --      Pain Score 09/13/22 1206 7     Pain Loc --      Pain Edu? --      Excl. in [place]? --    No data found.  Updated Vital Signs BP (!) 138/90 (BP Location: Right Arm)   Pulse 75   Temp 98.5 F (36.9 C) (Oral)   Resp 20   SpO2 98%   Visual Acuity Right Eye Distance:   Left Eye Distance:   Bilateral Distance:    Right Eye Near:   Left Eye Near:    Bilateral Near:     Physical Exam Vitals and nursing note reviewed.  Constitutional:      Appearance: He is well-developed.  HENT:     Head: Atraumatic.     Right Ear: External ear normal.  Left Ear: External ear normal.     Nose: Rhinorrhea present.     Mouth/Throat:     Pharynx: Posterior oropharyngeal erythema present. No oropharyngeal exudate.  Eyes:     Conjunctiva/sclera: Conjunctivae normal.     Pupils: Pupils are equal, round, and reactive to light.  Cardiovascular:     Rate and Rhythm: Normal rate and regular rhythm.  Pulmonary:     Effort: Pulmonary effort is normal. No respiratory distress.     Breath sounds: No wheezing or rales.  Musculoskeletal:        General: Normal range of motion.     Cervical back: Normal range of motion and neck supple.  Lymphadenopathy:     Cervical: No cervical adenopathy.  Skin:    General: Skin is warm and dry.  Neurological:     Mental Status: He is alert and oriented to person, place, and time.  Psychiatric:        Behavior: Behavior normal.      UC Treatments / Results  Labs (all labs ordered are listed, but only abnormal results are displayed) Labs Reviewed  CULTURE, GROUP A STREP [facility])  POCT RAPID STREP  A (OFFICE)    EKG   Radiology No results found.  Procedures Procedures (including critical care time)  Medications Ordered in UC Medications - No data to display  Initial Impression / Assessment and Plan / UC Course  I have reviewed the triage vital signs and the nursing notes.  Pertinent labs & imaging results that were available during my care of the patient were reviewed by me and considered in my medical decision making (see chart for details).     Vital signs and exam very reassuring today, suggestive of a viral upper respiratory infection.  Rapid strep negative, throat culture pending for further rule out.  Declines viral testing today.  Treat symptomatically with prednisone, Flonase, Phenergan DM and continue current regimen otherwise.  Work note given.  Return for worsening symptoms.  Final Clinical Impressions(s) / UC Diagnoses   Final diagnoses:  Acute pharyngitis, unspecified etiology  Viral URI with cough   Discharge Instructions   None    ED Prescriptions     Medication Sig Dispense Auth. Provider   predniSONE (DELTASONE) 20 MG tablet Take 2 tablets (40 mg total) by mouth daily with breakfast. 10 tablet Volney American, PA-C   fluticasone [facility]) 50 MCG/ACT nasal spray Place 1 spray into both nostrils 2 (two) times daily. [place], [place]   promethazine-dextromethorphan (PROMETHAZINE-DM) 6.25-15 MG/5ML syrup Take 5 mLs by mouth 4 (four) times daily as needed. 100 mL Volney American, [place]      PDMP not reviewed this encounter.   Volney American, [place] 09/13/22 1438

## 2022-09-13 NOTE — ED Triage Notes (Signed)
Pt reports he has nasal congestion that is going down into the back of his throat and a sore throat x 3 days. Painful to swallow. Took day and nyquil and claritin D.

## 2022-09-14 LAB — CULTURE, GROUP A STREP (THRC)

## 2022-09-15 ENCOUNTER — Telehealth (HOSPITAL_COMMUNITY): Payer: Self-pay | Admitting: Emergency Medicine

## 2022-09-15 MED ORDER — AMOXICILLIN 500 MG PO CAPS: 500.0000 mg | ORAL_CAPSULE | Freq: Two times a day (BID) | ORAL | 0 refills | Status: AC

## 2022-10-10 ENCOUNTER — Encounter: Payer: Self-pay | Admitting: Family Medicine

## 2022-10-10 ENCOUNTER — Ambulatory Visit (INDEPENDENT_AMBULATORY_CARE_PROVIDER_SITE_OTHER): Payer: 59 | Admitting: Family Medicine

## 2022-10-10 VITALS — BP 120/76 | HR 76 | Temp 98.1°F | Ht 72.0 in | Wt 243.0 lb

## 2022-10-10 DIAGNOSIS — Z0000 Encounter for general adult medical examination without abnormal findings: Secondary | ICD-10-CM

## 2022-10-10 NOTE — Assessment & Plan Note (Signed)
Doing well at this time. Will get records regarding labs and immunizations. Patient is to follow-up annually. We discussed that he will need a colonoscopy next year.  He is amenable to this.

## 2022-10-10 NOTE — Progress Notes (Signed)
   Subjective:  Patient ID: Jeffrey Cummings, male    DOB: 04/03/1981  Age: 42 y.o. MRN: [id number]  CC: Establish care.  HPI Jeffrey Cummings is a 42 y.o. male with a history of asthma presents to establish care.  Patient has history of asthma.  Uses albuterol intermittently.  doing well. Has been followed by a physician in [place].  He states that he had recent labs drawn.  We need to get records.  He works for the city of [employer] in [employer] traffic [employer].  He states that he is doing well at this time.  No chest pain or shortness of breath.  Good appetite.  No issues with his hearing or vision.   PMH, Surgical Hx, Family Hx, Social History reviewed and updated as below.  Past Medical History:  Diagnosis Date   Asthma    Past Surgical History:  Procedure Laterality Date   WISDOM TOOTH EXTRACTION     Family History  Problem Relation Age of Onset   Cancer Mother        stomach????   Social History   Tobacco Use   Smoking status: Former    Years: 20    Types: Cigarettes   Smokeless tobacco: Never  Substance Use Topics   Alcohol use: Yes    Alcohol/week: 20.0 standard drinks of alcohol    Types: 20 Shots of liquor per week    Review of Systems Per HPI Objective:   Today's Vitals: BP 120/76   Pulse 76   Temp 98.1 F (36.7 C)   Ht 6' (1.829 m)   Wt 243 lb (110.2 kg)   SpO2 97%   BMI 32.96 kg/m   Physical Exam Vitals and nursing note reviewed.  Constitutional:      General: He is not in acute distress.    Appearance: Normal appearance.  HENT:     Head: Normocephalic and atraumatic.  Eyes:     General:        Right eye: No discharge.        Left eye: No discharge.     Conjunctiva/sclera: Conjunctivae normal.  Cardiovascular:     Rate and Rhythm: Normal rate and regular rhythm.  Pulmonary:     Effort: Pulmonary effort is normal.     Breath sounds: Normal breath sounds. No wheezing, rhonchi or rales.  Neurological:     Mental Status: He is alert.   Psychiatric:        Mood and Affect: Mood normal.        Behavior: Behavior normal.    Assessment & Plan:   Problem List Items Addressed This Visit       Other   Annual physical exam - Primary    Doing well at this time. Will get records regarding labs and immunizations. Patient is to follow-up annually. We discussed that he will need a colonoscopy next year.  He is amenable to this.       Follow-up: Return in about 1 year (around 10/10/2023).  Everlene Other DO [facility]

## 2022-10-10 NOTE — Patient Instructions (Signed)
Follow up annually.  Call with concerns.  Take care  Dr. Sachi Boulay  

## 2023-01-21 ENCOUNTER — Other Ambulatory Visit: Payer: Self-pay | Admitting: Family Medicine

## 2023-01-23 NOTE — Telephone Encounter (Signed)
Urgent care provider Requested Prescriptions  Pending Prescriptions Disp Refills   fluticasone (FLONASE) 50 MCG/ACT nasal spray [Pharmacy Med Name: FLUTICASONE PROP 50 MCG SPRAY] 16 mL 2    Sig: PLACE 1 SPRAY INTO BOTH NOSTRILS 2 (TWO) TIMES DAILY     There is no refill protocol information for this order

## 2023-04-02 ENCOUNTER — Other Ambulatory Visit: Payer: Self-pay | Admitting: Family Medicine

## 2024-05-15 ENCOUNTER — Emergency Department (HOSPITAL_COMMUNITY)
Admission: EM | Admit: 2024-05-15 | Discharge: 2024-05-16 | Disposition: A | Attending: Emergency Medicine | Admitting: Emergency Medicine

## 2024-05-15 ENCOUNTER — Other Ambulatory Visit: Payer: Self-pay

## 2024-05-15 ENCOUNTER — Encounter (HOSPITAL_COMMUNITY): Payer: Self-pay | Admitting: Emergency Medicine

## 2024-05-15 DIAGNOSIS — R1084 Generalized abdominal pain: Secondary | ICD-10-CM | POA: Diagnosis not present

## 2024-05-15 DIAGNOSIS — K529 Noninfective gastroenteritis and colitis, unspecified: Secondary | ICD-10-CM

## 2024-05-15 DIAGNOSIS — R112 Nausea with vomiting, unspecified: Secondary | ICD-10-CM | POA: Insufficient documentation

## 2024-05-15 DIAGNOSIS — R42 Dizziness and giddiness: Secondary | ICD-10-CM | POA: Insufficient documentation

## 2024-05-15 DIAGNOSIS — R197 Diarrhea, unspecified: Secondary | ICD-10-CM | POA: Insufficient documentation

## 2024-05-15 LAB — CBC
HCT: 44.9 % (ref 39.0–52.0)
Hemoglobin: 15.5 g/dL (ref 13.0–17.0)
MCH: 27.6 pg (ref 26.0–34.0)
MCHC: 34.5 g/dL (ref 30.0–36.0)
MCV: 80 fL (ref 80.0–100.0)
Platelets: 251 K/uL (ref 150–400)
RBC: 5.61 MIL/uL (ref 4.22–5.81)
RDW: 13.2 % (ref 11.5–15.5)
WBC: 10.3 K/uL (ref 4.0–10.5)
nRBC: 0 % (ref 0.0–0.2)

## 2024-05-15 LAB — COMPREHENSIVE METABOLIC PANEL WITH GFR
ALT: 25 U/L (ref 0–44)
AST: 26 U/L (ref 15–41)
Albumin: 4.6 g/dL (ref 3.5–5.0)
Alkaline Phosphatase: 79 U/L (ref 38–126)
Anion gap: 14 (ref 5–15)
BUN: 18 mg/dL (ref 6–20)
CO2: 23 mmol/L (ref 22–32)
Calcium: 9.7 mg/dL (ref 8.9–10.3)
Chloride: 104 mmol/L (ref 98–111)
Creatinine, Ser: 1.09 mg/dL (ref 0.61–1.24)
GFR, Estimated: >60 mL/min (ref >60)
Glucose, Bld: 104 mg/dL — ABNORMAL HIGH (ref 70–99)
Potassium: 3.9 mmol/L (ref 3.5–5.1)
Sodium: 142 mmol/L (ref 135–145)
Total Bilirubin: 1 mg/dL (ref 0.0–1.2)
Total Protein: 7.6 g/dL (ref 6.5–8.1)

## 2024-05-15 LAB — LIPASE, BLOOD: Lipase: 37 U/L (ref 11–51)

## 2024-05-15 MED ORDER — KETOROLAC TROMETHAMINE 30 MG/ML IJ SOLN
30.0000 mg | Freq: Once | INTRAMUSCULAR | Status: AC
  Administered 2024-05-15: 30 mg via INTRAVENOUS
  Filled 2024-05-15: qty 1

## 2024-05-15 MED ORDER — SODIUM CHLORIDE 0.9 % IV BOLUS
1000.0000 mL | Freq: Once | INTRAVENOUS | Status: AC
  Administered 2024-05-15: 1000 mL via INTRAVENOUS

## 2024-05-15 MED ORDER — ONDANSETRON HCL 4 MG/2ML IJ SOLN
4.0000 mg | Freq: Once | INTRAMUSCULAR | Status: AC
  Administered 2024-05-15: 4 mg via INTRAVENOUS
  Filled 2024-05-15: qty 2

## 2024-05-15 NOTE — ED Triage Notes (Signed)
 Pt arrived to ED c/o N/V/D for the past 6 hours.

## 2024-05-15 NOTE — ED Provider Notes (Signed)
 [place] EMERGENCY DEPARTMENT AT [facility] Provider Note   CSN: [id number] Arrival date & time: 05/15/24  2140     Patient presents with: Emesis   Jeffrey Cummings is a 43 y.o. male.  {Add pertinent medical, surgical, social history, OB history to HPI:32947} Patient is a 43 year old male presenting with nausea, vomiting, diarrhea.  Symptoms started approximately 6 hours prior to presentation.  All has been nonbloody and nonbilious.  He denies any ill contacts or having consumed any undercooked or suspicious foods.  He feels very dehydrated and dizzy.       Prior to Admission medications   Medication Sig Start Date End Date Taking? Authorizing Provider  acyclovir (ZOVIRAX) 400 MG tablet Take 400 mg by mouth daily. 02/04/21   [provider]  albuterol (VENTOLIN HFA) 108 (90 Base) MCG/ACT inhaler Inhale into the lungs. 02/02/21   [provider]  fluticasone  (FLONASE ) 50 MCG/ACT nasal spray Place 1 spray into both nostrils 2 (two) times daily. 09/13/22   Stuart Vernell Norris, PA-C    Allergies: Shellfish allergy    Review of Systems  All other systems reviewed and are negative.   Updated Vital Signs BP 132/74   Pulse (!) 105   Temp 98.2 F (36.8 C) (Oral)   Resp 20   Ht 6' (1.829 m)   Wt 104.3 kg   SpO2 100%   BMI 31.19 kg/m   Physical Exam Vitals and nursing note reviewed.  Constitutional:      General: He is not in acute distress.    Appearance: He is well-developed. He is not diaphoretic.  HENT:     Head: Normocephalic and atraumatic.  Cardiovascular:     Rate and Rhythm: Normal rate and regular rhythm.     Heart sounds: No murmur heard.    No friction rub.  Pulmonary:     Effort: Pulmonary effort is normal. No respiratory distress.     Breath sounds: Normal breath sounds. No wheezing or rales.  Abdominal:     General: Bowel sounds are normal. There is no distension.     Palpations: Abdomen is soft.     Tenderness: There  is abdominal tenderness. There is no right CVA tenderness, left CVA tenderness, guarding or rebound.     Comments: There is mild generalized tenderness.  Musculoskeletal:        General: Normal range of motion.     Cervical back: Normal range of motion and neck supple.  Skin:    General: Skin is warm and dry.  Neurological:     Mental Status: He is alert and oriented to person, place, and time.     Coordination: Coordination normal.     (all labs ordered are listed, but only abnormal results are displayed) Labs Reviewed  CBC  LIPASE, BLOOD  COMPREHENSIVE METABOLIC PANEL WITH GFR  URINALYSIS, ROUTINE W REFLEX MICROSCOPIC    EKG: None  Radiology: No results found.  {Document cardiac monitor, telemetry assessment procedure when appropriate:32947} Procedures   Medications Ordered in the ED  ondansetron  (ZOFRAN ) injection 4 mg (has no administration in time range)  ketorolac  (TORADOL ) 30 MG/ML injection 30 mg (has no administration in time range)  sodium chloride  0.9 % bolus 1,000 mL (has no administration in time range)  sodium chloride  0.9 % bolus 1,000 mL (has no administration in time range)      {Click here for ABCD2, HEART and other calculators REFRESH Note before signing:1}  Medical Decision Making Amount and/or Complexity of Data Reviewed Labs: ordered.  Risk Prescription drug management.   ***  {Document critical care time when appropriate  Document review of labs and clinical decision tools ie CHADS2VASC2, etc  Document your independent review of radiology images and any outside records  Document your discussion with family members, caretakers and with consultants  Document social determinants of health affecting pt's care  Document your decision making why or why not admission, treatments were needed:32947:::1}   Final diagnoses:  None    ED Discharge Orders     None

## 2024-05-16 ENCOUNTER — Emergency Department (HOSPITAL_COMMUNITY)

## 2024-05-16 LAB — URINALYSIS, ROUTINE W REFLEX MICROSCOPIC
Bilirubin Urine: NEGATIVE
Glucose, UA: NEGATIVE mg/dL
Hgb urine dipstick: NEGATIVE
Ketones, ur: 20 mg/dL — AB
Leukocytes,Ua: NEGATIVE
Nitrite: NEGATIVE
Protein, ur: NEGATIVE mg/dL
Specific Gravity, Urine: 1.045 — ABNORMAL HIGH (ref 1.005–1.030)
pH: 5 (ref 5.0–8.0)

## 2024-05-16 MED ORDER — ONDANSETRON 8 MG PO TBDP: ORAL_TABLET | ORAL | 0 refills | Status: AC

## 2024-05-16 MED ORDER — MORPHINE SULFATE (PF) 4 MG/ML IV SOLN
4.0000 mg | Freq: Once | INTRAVENOUS | Status: AC
  Administered 2024-05-16: 4 mg via INTRAVENOUS
  Filled 2024-05-16: qty 1

## 2024-05-16 MED ORDER — IOHEXOL 300 MG/ML  SOLN
100.0000 mL | Freq: Once | INTRAMUSCULAR | Status: AC | PRN
  Administered 2024-05-16: 100 mL via INTRAVENOUS

## 2024-05-16 MED ORDER — ONDANSETRON HCL 4 MG/2ML IJ SOLN
4.0000 mg | Freq: Once | INTRAMUSCULAR | Status: AC
  Administered 2024-05-16: 4 mg via INTRAVENOUS
  Filled 2024-05-16: qty 2

## 2024-05-16 NOTE — ED Notes (Addendum)
 SABRA

## 2024-05-16 NOTE — Discharge Instructions (Signed)
Begin taking Zofran as prescribed as needed for nausea.  Clear liquids for the next 12 hours, then slowly advance diet as tolerated.  Return to the emergency department if symptoms significantly worsen or change.
